# Patient Record
Sex: Female | Born: 1955 | Race: White | Hispanic: No | Marital: Married | Smoking: Former smoker
Health system: Southern US, Community
[De-identification: ages and names within clinical notes are randomized; demographics above are authoritative.]

## PROBLEM LIST (undated history)

## (undated) DIAGNOSIS — G8929 Other chronic pain: Secondary | ICD-10-CM

## (undated) DIAGNOSIS — B019 Varicella without complication: Secondary | ICD-10-CM

## (undated) DIAGNOSIS — R51 Headache: Secondary | ICD-10-CM

## (undated) DIAGNOSIS — G35 Multiple sclerosis: Secondary | ICD-10-CM

## (undated) DIAGNOSIS — M8580 Other specified disorders of bone density and structure, unspecified site: Secondary | ICD-10-CM

## (undated) DIAGNOSIS — R519 Headache, unspecified: Secondary | ICD-10-CM

## (undated) DIAGNOSIS — Z1211 Encounter for screening for malignant neoplasm of colon: Secondary | ICD-10-CM

## (undated) DIAGNOSIS — K219 Gastro-esophageal reflux disease without esophagitis: Secondary | ICD-10-CM

## (undated) DIAGNOSIS — N390 Urinary tract infection, site not specified: Secondary | ICD-10-CM

## (undated) DIAGNOSIS — N952 Postmenopausal atrophic vaginitis: Secondary | ICD-10-CM

## (undated) DIAGNOSIS — M810 Age-related osteoporosis without current pathological fracture: Secondary | ICD-10-CM

## (undated) HISTORY — DX: Gastro-esophageal reflux disease without esophagitis: K21.9

## (undated) HISTORY — DX: Age-related osteoporosis without current pathological fracture: M81.0

## (undated) HISTORY — DX: Varicella without complication: B01.9

## (undated) HISTORY — DX: Postmenopausal atrophic vaginitis: N95.2

## (undated) HISTORY — DX: Other chronic pain: G89.29

## (undated) HISTORY — DX: Other specified disorders of bone density and structure, unspecified site: M85.80

## (undated) HISTORY — DX: Headache, unspecified: R51.9

## (undated) HISTORY — DX: Headache: R51

## (undated) HISTORY — DX: Urinary tract infection, site not specified: N39.0

## (undated) HISTORY — DX: Encounter for screening for malignant neoplasm of colon: Z12.11

## (undated) HISTORY — DX: Multiple sclerosis: G35

---

## 2011-03-16 ENCOUNTER — Ambulatory Visit: Payer: Self-pay | Admitting: Oncology

## 2011-07-16 LAB — HM DEXA SCAN

## 2011-07-16 LAB — HM MAMMOGRAPHY

## 2011-07-22 LAB — HM PAP SMEAR

## 2011-08-30 ENCOUNTER — Ambulatory Visit: Payer: Self-pay | Admitting: Internal Medicine

## 2011-09-21 ENCOUNTER — Ambulatory Visit (INDEPENDENT_AMBULATORY_CARE_PROVIDER_SITE_OTHER): Payer: 59 | Admitting: Internal Medicine

## 2011-09-21 ENCOUNTER — Encounter: Payer: Self-pay | Admitting: Internal Medicine

## 2011-09-21 VITALS — BP 130/70 | HR 86 | Temp 98.7°F | Wt 139.0 lb

## 2011-09-21 DIAGNOSIS — J40 Bronchitis, not specified as acute or chronic: Secondary | ICD-10-CM

## 2011-09-21 DIAGNOSIS — R599 Enlarged lymph nodes, unspecified: Secondary | ICD-10-CM

## 2011-09-21 DIAGNOSIS — R591 Generalized enlarged lymph nodes: Secondary | ICD-10-CM

## 2011-09-21 DIAGNOSIS — G35 Multiple sclerosis: Secondary | ICD-10-CM

## 2011-09-21 MED ORDER — AZITHROMYCIN 250 MG PO TABS: ORAL_TABLET | ORAL | Status: AC

## 2011-09-21 NOTE — Progress Notes (Signed)
Subjective:    Patient ID: Kim Ball, female    DOB: 03/23/1956, 55 y.o.   MRN: [id number]  HPI 55 year old female with a history of multiple sclerosis presents to establish care. Her primary concern today is a 2 week history of nasal congestion and cough. She reports that when she first became ill she had general malaise and a couple of days of diarrhea. This resolved, and then she developed worsening nasal congestion with clear rhinorrhea and cough productive of purulent sputum. She had some chills but no fever. She reports mild shortness of breath. She has been taking over-the-counter cough and cold preparations. She reports the greatest improvement with TheraFlu.  She also notes that approximately 4 months ago she developed a nodular areas over her entire abdomen and in her axilla. She was evaluated by her primary care physician who treated her with meloxicam. She had an allergic reaction to meloxicam and stopped this. She was then seen by her neurologist who was concerned about the possibility of lymphoma in the setting of use of Betaseron. He referred her to another physician and they ultimately wanted her to see an oncologist. This referral was not set up. She was then treated by her neurologist with a course of prednisone. She reports that the nodular areas resolved and to date have not recurred. She denies any night sweats. She denies any pruritus. She denies any change in fatigue. She denies any weight loss. She does have a family history of lymphoma.  Outpatient Encounter Prescriptions as of 09/21/2011  Medication Sig Dispense Refill  . azithromycin (ZITHROMAX Z-PAK) 250 MG tablet Take 2 tablets (500 mg) on  Day 1,  followed by 1 tablet (250 mg) once daily on Days 2 through 5.  6 each  0  . Estradiol (VAGIFEM) 10 MCG TABS Place 1 tablet vaginally 2 (two) times a week.        . estradiol (VIVELLE-DOT) 0.025 MG/24HR Place 1 patch onto the skin every 3 (three) days.        . interferon beta-1b  (BETASERON) 0.3 MG injection Inject 0.5 mg into the skin every other day.        . metaxalone (SKELAXIN) 800 MG tablet Take 800 mg by mouth 3 (three) times daily as needed.        . nitrofurantoin, macrocrystal-monohydrate, (MACROBID) 100 MG capsule Take 100 mg by mouth once. After intercourse       . topiramate (TOPAMAX) 50 MG tablet Take 25 mg by mouth 2 (two) times daily.          Review of Systems  Constitutional: Negative for fever, chills, appetite change, fatigue and unexpected weight change.  HENT: Positive for congestion, rhinorrhea, postnasal drip and sinus pressure. Negative for ear pain, sore throat, trouble swallowing, neck pain and voice change.   Eyes: Negative for visual disturbance.  Respiratory: Positive for cough and shortness of breath. Negative for wheezing and stridor.   Cardiovascular: Negative for chest pain, palpitations and leg swelling.  Gastrointestinal: Positive for diarrhea. Negative for nausea, vomiting, abdominal pain, constipation, blood in stool, abdominal distention and anal bleeding.  Genitourinary: Negative for dysuria and flank pain.  Musculoskeletal: Negative for myalgias, arthralgias and gait problem.  Skin: Negative for color change and rash.  Neurological: Negative for dizziness and headaches.  Hematological: Positive for adenopathy. Does not bruise/bleed easily.  Psychiatric/Behavioral: Negative for suicidal ideas, sleep disturbance and dysphoric mood. The patient is not nervous/anxious.    BP 130/70  Pulse 86  Temp(Src)  98.7 F (37.1 C) (Oral)  Wt 139 lb (63.05 kg)  SpO2 97%     Objective:   Physical Exam  Constitutional: She is oriented to person, place, and time. She appears well-developed and well-nourished. No distress.  HENT:  Head: Normocephalic and atraumatic.  Right Ear: External ear normal.  Left Ear: External ear normal.  Nose: Nose normal.  Mouth/Throat: Oropharynx is clear and moist. No oropharyngeal exudate.  Eyes:  Conjunctivae are normal. Pupils are equal, round, and reactive to light. Right eye exhibits no discharge. Left eye exhibits no discharge. No scleral icterus.  Neck: Normal range of motion. Neck supple. No tracheal deviation present. No thyromegaly present.  Cardiovascular: Normal rate, regular rhythm, normal heart sounds and intact distal pulses.  Exam reveals no gallop and no friction rub.   No murmur heard. Pulmonary/Chest: Effort normal. No accessory muscle usage. Not tachypneic. No respiratory distress. She has no decreased breath sounds. She has no wheezes. She has rhonchi in the right lower field. She has no rales. She exhibits no tenderness.  Musculoskeletal: Normal range of motion. She exhibits no edema and no tenderness.  Lymphadenopathy:    She has cervical adenopathy.       Right cervical: Superficial cervical adenopathy present.  Neurological: She is alert and oriented to person, place, and time. No cranial nerve deficit. She exhibits normal muscle tone. Coordination normal.  Skin: Skin is warm and dry. No rash noted. She is not diaphoretic. No erythema. No pallor.  Psychiatric: She has a normal mood and affect. Her behavior is normal. Judgment and thought content normal.          Assessment & Plan:  1. Bronchitis - symptoms and exam most consistent with bronchitis. Will treat with azithromycin. She will continue over-the-counter cough and cold preparations as needed for cough. She will call if her symptoms are not improving within the next 48 hours.  2. Lymphadenopathy - recent episode of diffuse lymphadenopathy is concerning for lymphoma especially given her family history and her use of Betaseron. I am concerned that she received prednisone as treatment for this given that prednisone would be part of the treatment for lymphoma and may have suppressed some of her lymphadenopathy. However, it is reassuring that she has not had any recurrence within the last 4 months. We will  continue to monitor. We discussed possibly checking lab work including CBC, LDH, uric acid, sedimentation rate, CRP. She would prefer to hold off for now. We will obtain her previous records and she will followup in one month.  3. Multiple Sclerosis - Will get records from neurologist.

## 2011-10-22 ENCOUNTER — Ambulatory Visit: Payer: 59 | Admitting: Internal Medicine

## 2011-11-08 ENCOUNTER — Ambulatory Visit: Payer: 59 | Admitting: Internal Medicine

## 2011-11-19 ENCOUNTER — Ambulatory Visit: Payer: 59 | Admitting: Internal Medicine

## 2011-11-22 ENCOUNTER — Ambulatory Visit (INDEPENDENT_AMBULATORY_CARE_PROVIDER_SITE_OTHER): Payer: 59 | Admitting: Internal Medicine

## 2011-11-22 ENCOUNTER — Encounter: Payer: Self-pay | Admitting: Internal Medicine

## 2011-11-22 DIAGNOSIS — R222 Localized swelling, mass and lump, trunk: Secondary | ICD-10-CM | POA: Insufficient documentation

## 2011-11-22 DIAGNOSIS — L659 Nonscarring hair loss, unspecified: Secondary | ICD-10-CM | POA: Insufficient documentation

## 2011-11-22 LAB — CBC WITH DIFFERENTIAL/PLATELET
Basophils Relative: 0.9 % (ref 0.0–3.0)
Eosinophils Relative: 1.5 % (ref 0.0–5.0)
Lymphocytes Relative: 39.1 % (ref 12.0–46.0)
Neutrophils Relative %: 47.9 % (ref 43.0–77.0)
Platelets: 239 10*3/uL (ref 150.0–400.0)
RBC: 4.28 Mil/uL (ref 3.87–5.11)
WBC: 6.9 10*3/uL (ref 4.5–10.5)

## 2011-11-22 LAB — COMPREHENSIVE METABOLIC PANEL
AST: 20 U/L (ref 0–37)
Albumin: 3.8 g/dL (ref 3.5–5.2)
Alkaline Phosphatase: 60 U/L (ref 39–117)
Glucose, Bld: 88 mg/dL (ref 70–99)
Potassium: 3.9 mEq/L (ref 3.5–5.1)
Sodium: 139 mEq/L (ref 135–145)
Total Bilirubin: 0.4 mg/dL (ref 0.3–1.2)
Total Protein: 6.6 g/dL (ref 6.0–8.3)

## 2011-11-22 LAB — SEDIMENTATION RATE: Sed Rate: 24 mm/hr — ABNORMAL HIGH (ref 0–22)

## 2011-11-22 LAB — TSH: TSH: 0.93 u[IU]/mL (ref 0.35–5.50)

## 2011-11-22 NOTE — Progress Notes (Signed)
Subjective:    Patient ID: Kim Ball, female    DOB: Jan 28, 1956, 56 y.o.   MRN: [id number]  HPI 56 YO female with history of MS presents for followup. She is concerned today about her current nodules over her abdomen. This happened in the past and she was treated with a course of steroids by her neurologist with resolution of the nodular areas. She notes that the nodular areas have recurred both in her anterior abdomen and on her right lateral chest wall. They are not tender. She has also noted some lymphadenopathy in her neck. She denies any fever, chills, night sweats, change in energy level. She does have some chronic fatigue with her MS. She denies any rash or pruritis. She denies any abdominal pain, nausea, vomiting. She denies change in bowel habits. She has noted some hair loss described as spinning of her hair across her anterior scalp. This is been going on for several weeks. She recently stopped taking her Topamax because she was concerned that this medication might be leading to hair loss. She has not noted any change yet.  Outpatient Encounter Prescriptions as of 11/22/2011  Medication Sig Dispense Refill  . Estradiol (VAGIFEM) 10 MCG TABS Place 1 tablet vaginally 2 (two) times a week.        . estradiol (VIVELLE-DOT) 0.025 MG/24HR Place 1 patch onto the skin every 3 (three) days.        . interferon beta-1b (BETASERON) 0.3 MG injection Inject 0.5 mg into the skin every other day.        . metaxalone (SKELAXIN) 800 MG tablet Take 800 mg by mouth 3 (three) times daily as needed.        . nitrofurantoin, macrocrystal-monohydrate, (MACROBID) 100 MG capsule Take 100 mg by mouth once. After intercourse       . DISCONTD: topiramate (TOPAMAX) 50 MG tablet Take 25 mg by mouth 2 (two) times daily.          Review of Systems  Constitutional: Positive for fatigue. Negative for fever, chills, appetite change and unexpected weight change.  HENT: Negative for ear pain, congestion, sore throat,  trouble swallowing, neck pain, voice change and sinus pressure.   Eyes: Negative for visual disturbance.  Respiratory: Negative for cough, shortness of breath, wheezing and stridor.   Cardiovascular: Negative for chest pain, palpitations and leg swelling.  Gastrointestinal: Negative for nausea, vomiting, abdominal pain, diarrhea, constipation, blood in stool, abdominal distention and anal bleeding.  Genitourinary: Negative for dysuria and flank pain.  Musculoskeletal: Negative for myalgias, arthralgias and gait problem.  Skin: Negative for color change and rash.  Neurological: Negative for dizziness and headaches.  Hematological: Positive for adenopathy. Does not bruise/bleed easily.  Psychiatric/Behavioral: Negative for suicidal ideas, sleep disturbance and dysphoric mood. The patient is not nervous/anxious.    BP 108/78  Pulse 85  Temp(Src) 97.8 F (36.6 C) (Oral)  Ht 5\' 2"  (1.575 m)  Wt 142 lb (64.411 kg)  BMI 25.97 kg/m2  SpO2 96%     Objective:   Physical Exam  Constitutional: She is oriented to person, place, and time. She appears well-developed and well-nourished. No distress.  HENT:  Head: Normocephalic and atraumatic.  Right Ear: External ear normal.  Left Ear: External ear normal.  Nose: Nose normal.  Mouth/Throat: Oropharynx is clear and moist. No oropharyngeal exudate.  Eyes: Conjunctivae are normal. Pupils are equal, round, and reactive to light. Right eye exhibits no discharge. Left eye exhibits no discharge. No scleral icterus.  Neck:  Normal range of motion. Neck supple. No tracheal deviation present. No thyromegaly present.  Cardiovascular: Normal rate, regular rhythm, normal heart sounds and intact distal pulses.  Exam reveals no gallop and no friction rub.   No murmur heard. Pulmonary/Chest: Effort normal and breath sounds normal. No respiratory distress. She has no wheezes. She has no rales. She exhibits no tenderness.  Abdominal: Soft. Bowel sounds are normal.  She exhibits mass. She exhibits no distension. There is tenderness in the left lower quadrant. There is no rebound and no guarding.    Musculoskeletal: Normal range of motion. She exhibits no edema and no tenderness.  Lymphadenopathy:    She has no cervical adenopathy.  Neurological: She is alert and oriented to person, place, and time. No cranial nerve deficit. She exhibits normal muscle tone. Coordination normal.  Skin: Skin is warm and dry. No rash noted. She is not diaphoretic. No erythema. No pallor.  Psychiatric: She has a normal mood and affect. Her behavior is normal. Judgment and thought content normal.          Assessment & Plan:

## 2011-11-22 NOTE — Assessment & Plan Note (Addendum)
Unclear etiology. Symptoms are recurrent. During her last episode, she was placed on steroids by her neurologist and had improvement in her nodules. Question if she may have lymphadenopathy, such as in the case of lymphoma which is responsive to steroids. Will send basic lab work today. Including, CBC, CMP, ESR, CRP, LDH. If lab work is normal, will proceed with CT of the abdomen for further evaluation. On exam, it does not appear that any of the nodular areas are prominent enough for superficial biopsy.

## 2011-11-22 NOTE — Assessment & Plan Note (Signed)
Unclear etiology.  Will see if any improvement off topamax. Will also check TSH with labs.

## 2011-11-23 LAB — ANTI-NUCLEAR AB-TITER (ANA TITER): ANA Titer 1: 1:40 {titer} — ABNORMAL HIGH

## 2011-11-23 LAB — ANA: Anti Nuclear Antibody(ANA): POSITIVE — AB

## 2011-12-20 ENCOUNTER — Encounter: Payer: Self-pay | Admitting: Internal Medicine

## 2011-12-20 ENCOUNTER — Ambulatory Visit (INDEPENDENT_AMBULATORY_CARE_PROVIDER_SITE_OTHER): Payer: 59 | Admitting: Internal Medicine

## 2011-12-20 VITALS — BP 110/68 | HR 86 | Temp 98.4°F | Wt 145.0 lb

## 2011-12-20 DIAGNOSIS — R222 Localized swelling, mass and lump, trunk: Secondary | ICD-10-CM

## 2011-12-20 DIAGNOSIS — L650 Telogen effluvium: Secondary | ICD-10-CM

## 2011-12-20 DIAGNOSIS — E785 Hyperlipidemia, unspecified: Secondary | ICD-10-CM

## 2011-12-20 LAB — HM COLONOSCOPY

## 2011-12-20 NOTE — Assessment & Plan Note (Signed)
Areas have been stable. Patient was evaluated by her dermatologist who felt they were most consistent with lipoma. Lab work was normal. We'll continue to monitor.

## 2011-12-20 NOTE — Progress Notes (Signed)
Subjective:    Patient ID: Kim Ball, female    DOB: 1956-01-23, 56 y.o.   MRN: [id number]  HPI 56 year old female with history of MS presents for followup. She was last seen and evaluated for nodular areas over her abdomen. 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 She also had lab work including CBC, LDH, CMP all of which were normal. She reports that the lesions have been stable since her last visit in are not bothersome to her.  She was also evaluated for hair loss. 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 She has been started on Lidex but has not noticed a significant change over the last week.  Outpatient Encounter Prescriptions as of 12/20/2011  Medication Sig Dispense Refill  . Ascorbic Acid (VITAMIN C) 100 MG tablet Take 100 mg by mouth daily.      . Calcium-Vitamin D-Vitamin K (CALCIUM + D + K PO) Take by mouth.      . Estradiol (VAGIFEM) 10 MCG TABS Place 1 tablet vaginally 2 (two) times a week.        . estradiol (VIVELLE-DOT) 0.025 MG/24HR Place 1 patch onto the skin every 3 (three) days.        . fish oil-omega-3 fatty acids 1000 MG capsule Take 2 g by mouth daily.      . fluocinonide (LIDEX) 0.05 % external solution Apply 1 application topically 2 (two) times daily.      . interferon beta-1b (BETASERON) 0.3 MG injection Inject 0.5 mg into the skin every other day.        . metaxalone (SKELAXIN) 800 MG tablet Take 800 mg by mouth 3 (three) times daily as needed.        . Multiple Vitamins-Minerals (MULTIVITAMIN WITH MINERALS) tablet Take 1 tablet by mouth daily.      . nitrofurantoin, macrocrystal-monohydrate, (MACROBID) 100 MG capsule Take 100 mg by mouth once. After intercourse         Review of Systems  Constitutional: Negative for fever, chills, appetite change, fatigue and unexpected weight change.  HENT: Negative for ear pain, congestion, sore throat, trouble swallowing,  neck pain, voice change and sinus pressure.   Eyes: Negative for visual disturbance.  Respiratory: Negative for cough, shortness of breath, wheezing and stridor.   Cardiovascular: Negative for chest pain, palpitations and leg swelling.  Gastrointestinal: Negative for nausea, vomiting, abdominal pain, diarrhea, constipation, blood in stool, abdominal distention and anal bleeding.  Genitourinary: Negative for dysuria and flank pain.  Musculoskeletal: Negative for myalgias, arthralgias and gait problem.  Skin: Negative for color change and rash.  Neurological: Negative for dizziness and headaches.  Hematological: Negative for adenopathy. Does not bruise/bleed easily.  Psychiatric/Behavioral: Negative for suicidal ideas, sleep disturbance and dysphoric mood. The patient is not nervous/anxious.    BP 110/68  Pulse 86  Temp(Src) 98.4 F (36.9 C) (Oral)  Wt 145 lb (65.772 kg)  SpO2 95%     Objective:   Physical Exam  Constitutional: She is oriented to person, place, and time. She appears well-developed and well-nourished. No distress.  HENT:  Head: Normocephalic and atraumatic.  Right Ear: External ear normal.  Left Ear: External ear normal.  Nose: Nose normal.  Mouth/Throat: Oropharynx is clear and moist. No oropharyngeal exudate.  Eyes: Conjunctivae are normal. Pupils are equal, round, and reactive to light. Right eye exhibits no discharge. Left eye exhibits no discharge. No scleral icterus.  Neck:  Normal range of motion. Neck supple. No tracheal deviation present. No thyromegaly present.  Cardiovascular: Normal rate, regular rhythm, normal heart sounds and intact distal pulses.  Exam reveals no gallop and no friction rub.   No murmur heard. Pulmonary/Chest: Effort normal and breath sounds normal. No respiratory distress. She has no wheezes. She has no rales. She exhibits no tenderness.  Musculoskeletal: Normal range of motion. She exhibits no edema and no tenderness.  Lymphadenopathy:      She has no cervical adenopathy.  Neurological: She is alert and oriented to person, place, and time. No cranial nerve deficit. She exhibits normal muscle tone. Coordination normal.  Skin: Skin is warm and dry. No rash noted. She is not diaphoretic. No erythema. No pallor.       Thinning of hair noted bilateral forehead at hairline.  Psychiatric: She has a normal mood and affect. Her behavior is normal. Judgment and thought content normal.          Assessment & Plan:

## 2011-12-20 NOTE — Assessment & Plan Note (Signed)
Seen by dermatology and started on Lidex. We'll continue to monitor to see if any improvement. Followup in one year or as needed.

## 2012-03-11 ENCOUNTER — Telehealth: Payer: Self-pay | Admitting: Internal Medicine

## 2012-03-11 NOTE — Telephone Encounter (Signed)
Caller: [name]/Patient; PCP: Ronna Polio; CB#: [phone]. Call regarding Cold Sxs that began Monday 5/20. Non productive cough despite expectorant, ears draining, ear congestion, eyes are matted, headache, facial pain and pain in her teeth. Reports she has felt worse and worse over the past 2 days. Very weak, tires easily and has to sit freq. Caller reports she had pneumonia appx 2 months ago and feels as if it is returning. Afebrile.  Per URI Protocol, See in 24 Hr Protocol, No open appts in [facility] until Friday. Caller advised to be seen at [facility] for eval of sxs. She is agreeable.

## 2012-11-21 ENCOUNTER — Telehealth: Payer: Self-pay | Admitting: Internal Medicine

## 2012-11-21 NOTE — Telephone Encounter (Signed)
Patient is wanting her labs draw at this office that Dr. Clelia Croft and [facility] have ordered. Will it be ok to have this done at this office. If this is ok please call the patient.

## 2012-11-24 NOTE — Telephone Encounter (Signed)
Patient does have the lab order from the dermatologist. Dr. Clelia Croft office will call this office to inform us of the labs that need to be done.

## 2012-11-25 ENCOUNTER — Telehealth: Payer: Self-pay | Admitting: General Practice

## 2012-11-25 NOTE — Telephone Encounter (Signed)
Can we wait until her physical to do her labs?

## 2012-11-25 NOTE — Telephone Encounter (Signed)
[facility] called stating that they have been trying to fax paperwork over regarding this pt. States she needs the following labs :CBC, LFT, Bilirubin, VZV Antibody Serology, and an EKG. Pt should be making an appt. Has a CPE on 12/22/12.

## 2012-11-26 ENCOUNTER — Telehealth: Payer: Self-pay | Admitting: Internal Medicine

## 2012-11-26 NOTE — Telephone Encounter (Signed)
Received message from pt neurologist. He requested EKG, and labs including CBC, CMP, and VZV antibody to be faxed to [phone]. We can perform this at her physical exam

## 2012-11-26 NOTE — Telephone Encounter (Addendum)
Insurance is not covering her medication anymore for her MS, so they are trying to get all this done so she may get on new medication. Also she seen her dermatologist and he is requesting blood work too. Per patient she does have enough injections to last until then and if ok, she can come in a week earlier from her CPE to have those labs done.

## 2012-11-26 NOTE — Telephone Encounter (Signed)
OK. Let's move her physical one week earlier if possible

## 2012-11-26 NOTE — Telephone Encounter (Signed)
Nothing is available a week prior for her to be moved up.

## 2012-11-26 NOTE — Telephone Encounter (Signed)
Spoke with patient, she will bring their orders in when she comes to have other labs done.

## 2012-11-29 ENCOUNTER — Other Ambulatory Visit: Payer: Self-pay

## 2012-12-01 NOTE — Telephone Encounter (Signed)
Patient is aware of this

## 2012-12-22 ENCOUNTER — Encounter: Payer: Self-pay | Admitting: Internal Medicine

## 2012-12-22 ENCOUNTER — Ambulatory Visit (INDEPENDENT_AMBULATORY_CARE_PROVIDER_SITE_OTHER): Payer: 59 | Admitting: Internal Medicine

## 2012-12-22 ENCOUNTER — Telehealth: Payer: Self-pay | Admitting: Internal Medicine

## 2012-12-22 VITALS — BP 120/92 | HR 83 | Temp 98.2°F | Ht 62.0 in | Wt 144.0 lb

## 2012-12-22 DIAGNOSIS — R9431 Abnormal electrocardiogram [ECG] [EKG]: Secondary | ICD-10-CM

## 2012-12-22 DIAGNOSIS — Z0000 Encounter for general adult medical examination without abnormal findings: Secondary | ICD-10-CM

## 2012-12-22 DIAGNOSIS — G35 Multiple sclerosis: Secondary | ICD-10-CM

## 2012-12-22 LAB — CBC WITH DIFFERENTIAL/PLATELET
Basophils Absolute: 0.1 10*3/uL (ref 0.0–0.1)
Basophils Relative: 0.6 % (ref 0.0–3.0)
Eosinophils Absolute: 0.1 10*3/uL (ref 0.0–0.7)
Lymphocytes Relative: 29.8 % (ref 12.0–46.0)
MCHC: 34.2 g/dL (ref 30.0–36.0)
Neutrophils Relative %: 58.4 % (ref 43.0–77.0)
RBC: 4.74 Mil/uL (ref 3.87–5.11)

## 2012-12-22 NOTE — Assessment & Plan Note (Signed)
General medical exam normal today including breast exam. PAP and pelvic deferred as completed by her OB. Mammogram UTD, will request copy. Will check CBC, CMP, lipids with labs today.

## 2012-12-22 NOTE — Telephone Encounter (Signed)
[phone]  Angelina Pih called from Dr. Margaretmary Eddy office regarding pt Gilen[place] preassessments needed for Gilenya being requested by neurologist Dr. Sherryll Burger.  Pt has appt at 10:30. Needed:   CBC, LFTs, bilirubin, hx of chicken pox or varicella, EKG  These are needed to determine if she can move forward for Gilenya.

## 2012-12-22 NOTE — Telephone Encounter (Signed)
Pt states she has an appt this morning at 10:30 for a physical but would like to change the appt to be able to get an EKG and blood work that is needed for the pt's Jelenia injections for MS.  Pt states Dr. Margaretmary Eddy office has been trying to get with [place] regarding orders that are needed to begin the pt on [place].

## 2012-12-22 NOTE — Assessment & Plan Note (Signed)
Per neurologist request will check CMP, varicella immunity today prior to pt starting new medication.

## 2012-12-22 NOTE — Progress Notes (Signed)
Subjective:    Patient ID: Kim Ball, female    DOB: 01-27-1956, 57 y.o.   MRN: [id number]  HPI 57 year old female presents for annual exam. She reports she is generally doing well. She notes that her neurologist is planning to change her medication for multiple sclerosis to another medication and she needs to have lab work and EKG performed. She is looking forward to being on a non-injectable medication. She reports that symptoms have generally been well-controlled. She reports she is generally feeling well. She has no concerns today. She tries to follow a healthy diet and get regular physical activity. She is UTD on PAP, which is performed by OB/GYN.  She is UTD on mammogram. She declines colonoscopy.   Outpatient Encounter Prescriptions as of 12/22/2012  Medication Sig Dispense Refill  . Ascorbic Acid (VITAMIN C) 100 MG tablet Take 100 mg by mouth daily.      . Calcium-Vitamin D-Vitamin K (CALCIUM + D + K PO) Take by mouth.      . Estradiol (VAGIFEM) 10 MCG TABS Place 1 tablet vaginally 2 (two) times a week.        . estradiol (VIVELLE-DOT) 0.025 MG/24HR Place 1 patch onto the skin every 3 (three) days.        . fish oil-omega-3 fatty acids 1000 MG capsule Take 2 g by mouth daily.      . fluocinonide (LIDEX) 0.05 % external solution Apply 1 application topically 2 (two) times daily.      . interferon beta-1b (BETASERON) 0.3 MG injection Inject 0.5 mg into the skin every other day.        . metaxalone (SKELAXIN) 800 MG tablet Take 800 mg by mouth 3 (three) times daily as needed.        . Multiple Vitamins-Minerals (MULTIVITAMIN WITH MINERALS) tablet Take 1 tablet by mouth daily.      . nitrofurantoin, macrocrystal-monohydrate, (MACROBID) 100 MG capsule Take 100 mg by mouth once. After intercourse        No facility-administered encounter medications on file as of 12/22/2012.   BP 120/92  Pulse 83  Temp(Src) 98.2 F (36.8 C) (Oral)  Ht 5\' 2"  (1.575 m)  Wt 144 lb (65.318 kg)  BMI 26.33  kg/m2  SpO2 96%  Review of Systems  Constitutional: Negative for fever, chills, appetite change, fatigue and unexpected weight change.  HENT: Negative for ear pain, congestion, sore throat, trouble swallowing, neck pain, voice change and sinus pressure.   Eyes: Negative for visual disturbance.  Respiratory: Negative for cough, shortness of breath, wheezing and stridor.   Cardiovascular: Negative for chest pain, palpitations and leg swelling.  Gastrointestinal: Negative for nausea, vomiting, abdominal pain, diarrhea, constipation, blood in stool, abdominal distention and anal bleeding.  Genitourinary: Negative for dysuria and flank pain.  Musculoskeletal: Negative for myalgias, arthralgias and gait problem.  Skin: Negative for color change and rash.  Neurological: Negative for dizziness and headaches.  Hematological: Negative for adenopathy. Does not bruise/bleed easily.  Psychiatric/Behavioral: Negative for suicidal ideas, sleep disturbance and dysphoric mood. The patient is not nervous/anxious.        Objective:   Physical Exam  Constitutional: She is oriented to person, place, and time. She appears well-developed and well-nourished. No distress.  HENT:  Head: Normocephalic and atraumatic.  Right Ear: External ear normal.  Left Ear: External ear normal.  Nose: Nose normal.  Mouth/Throat: Oropharynx is clear and moist. No oropharyngeal exudate.  Eyes: Conjunctivae are normal. Pupils are equal, round, and reactive to  light. Right eye exhibits no discharge. Left eye exhibits no discharge. No scleral icterus.  Neck: Normal range of motion. Neck supple. No tracheal deviation present. No thyromegaly present.  Cardiovascular: Normal rate, regular rhythm, normal heart sounds and intact distal pulses.  Exam reveals no gallop and no friction rub.   No murmur heard. Pulmonary/Chest: Effort normal and breath sounds normal. No accessory muscle usage. Not tachypneic. No respiratory distress. She  has no decreased breath sounds. She has no wheezes. She has no rhonchi. She has no rales. She exhibits no tenderness. Right breast exhibits no inverted nipple, no mass, no nipple discharge, no skin change and no tenderness. Left breast exhibits no inverted nipple, no mass, no nipple discharge, no skin change and no tenderness. Breasts are symmetrical.  Abdominal: Soft. Bowel sounds are normal. She exhibits no distension and no mass. There is no tenderness. There is no rebound and no guarding.  Musculoskeletal: Normal range of motion. She exhibits no edema and no tenderness.  Lymphadenopathy:    She has no cervical adenopathy.  Neurological: She is alert and oriented to person, place, and time. No cranial nerve deficit. She exhibits normal muscle tone. Coordination normal.  Skin: Skin is warm and dry. No rash noted. She is not diaphoretic. No erythema. No pallor.  Psychiatric: She has a normal mood and affect. Her behavior is normal. Judgment and thought content normal.          Assessment & Plan:

## 2012-12-22 NOTE — Assessment & Plan Note (Signed)
Discussed with pt. No symptoms either current or in the past of chest pain, dyspnea, exercise intolerance. Discussed potentially getting additional testing including stress test, but will discuss first with her neurologist who requested EKG prior to starting new medication for MS.

## 2012-12-23 LAB — LIPID PANEL
Total CHOL/HDL Ratio: 6
Triglycerides: 357 mg/dL — ABNORMAL HIGH (ref 0.0–149.0)

## 2012-12-23 LAB — COMPREHENSIVE METABOLIC PANEL
ALT: 21 U/L (ref 0–35)
AST: 24 U/L (ref 0–37)
Albumin: 3.9 g/dL (ref 3.5–5.2)
Alkaline Phosphatase: 61 U/L (ref 39–117)
BUN: 15 mg/dL (ref 6–23)
Potassium: 4.1 mEq/L (ref 3.5–5.1)
Sodium: 138 mEq/L (ref 135–145)
Total Protein: 6.9 g/dL (ref 6.0–8.3)

## 2012-12-23 LAB — FERRITIN: Ferritin: 590.7 ng/mL — ABNORMAL HIGH (ref 10.0–291.0)

## 2012-12-23 LAB — VARICELLA ZOSTER ANTIBODY, IGG: Varicella IgG: >4000 Index — ABNORMAL HIGH (ref <135.00)

## 2013-02-14 ENCOUNTER — Emergency Department: Payer: Self-pay | Admitting: Emergency Medicine

## 2013-02-14 LAB — COMPREHENSIVE METABOLIC PANEL
Albumin: 3.5 g/dL (ref 3.4–5.0)
Calcium, Total: 9 mg/dL (ref 8.5–10.1)
Co2: 25 mmol/L (ref 21–32)
Creatinine: 0.79 mg/dL (ref 0.60–1.30)
EGFR (African American): >60
Glucose: 96 mg/dL (ref 65–99)
Osmolality: 285 (ref 275–301)
SGOT(AST): 35 U/L (ref 15–37)

## 2013-02-14 LAB — URINALYSIS, COMPLETE
Bacteria: NONE SEEN
Bilirubin,UR: NEGATIVE
Blood: NEGATIVE
Glucose,UR: NEGATIVE mg/dL (ref 0–75)
Protein: NEGATIVE
RBC,UR: <1 /HPF (ref 0–5)
Specific Gravity: 1.019 (ref 1.003–1.030)
Squamous Epithelial: 1
WBC UR: <1 /HPF (ref 0–5)

## 2013-02-14 LAB — CBC
HGB: 15.4 g/dL (ref 12.0–16.0)
MCV: 92 fL (ref 80–100)
Platelet: 287 10*3/uL (ref 150–440)
RBC: 4.86 10*6/uL (ref 3.80–5.20)
RDW: 14.1 % (ref 11.5–14.5)

## 2013-03-13 ENCOUNTER — Encounter: Payer: Self-pay | Admitting: Internal Medicine

## 2013-03-13 ENCOUNTER — Ambulatory Visit (INDEPENDENT_AMBULATORY_CARE_PROVIDER_SITE_OTHER): Payer: 59 | Admitting: Internal Medicine

## 2013-03-13 ENCOUNTER — Telehealth: Payer: Self-pay | Admitting: Internal Medicine

## 2013-03-13 VITALS — BP 100/80 | HR 87 | Wt 145.0 lb

## 2013-03-13 DIAGNOSIS — B002 Herpesviral gingivostomatitis and pharyngotonsillitis: Secondary | ICD-10-CM

## 2013-03-13 MED ORDER — VALACYCLOVIR HCL 1 G PO TABS: 1000.0000 mg | ORAL_TABLET | Freq: Two times a day (BID) | ORAL | Status: AC

## 2013-03-13 NOTE — Assessment & Plan Note (Signed)
Severe herpes stomatitis breakout after starting new immunosuppressive regimen for MS. Will treat with Valtrex and continue topical anesthetic. Follow up if symptoms are not improving over next 48hr. Follow up next week for recheck.

## 2013-03-13 NOTE — Telephone Encounter (Signed)
She will need to be seen for this. Can we work her in this afternoon?

## 2013-03-13 NOTE — Telephone Encounter (Signed)
Patient scheduled and seen.

## 2013-03-13 NOTE — Progress Notes (Signed)
  Subjective:    Patient ID: Kim Ball, female    DOB: 02-16-1956, 57 y.o.   MRN: [id number]  HPI 57YO female with MS presents for acute visit complaining of several days of diffuse oral lesions. Lesions are ulcerated and painful. No improvement with topical numbing meds. Notes recent Solumedrol dose pack for MS flare 2 weeks ago. No trouble swallowing. No shortness of breath, fever.  Outpatient Prescriptions Prior to Visit  Medication Sig Dispense Refill  . Ascorbic Acid (VITAMIN C) 100 MG tablet Take 100 mg by mouth daily.      . Calcium-Vitamin D-Vitamin K (CALCIUM + D + K PO) Take by mouth.      . Estradiol (VAGIFEM) 10 MCG TABS Place 1 tablet vaginally 2 (two) times a week.        . estradiol (VIVELLE-DOT) 0.025 MG/24HR Place 1 patch onto the skin every 3 (three) days.        . fish oil-omega-3 fatty acids 1000 MG capsule Take 2 g by mouth daily.      . fluocinonide (LIDEX) 0.05 % external solution Apply 1 application topically 2 (two) times daily.      . metaxalone (SKELAXIN) 800 MG tablet Take 800 mg by mouth 3 (three) times daily as needed.        . Multiple Vitamins-Minerals (MULTIVITAMIN WITH MINERALS) tablet Take 1 tablet by mouth daily.      . nitrofurantoin, macrocrystal-monohydrate, (MACROBID) 100 MG capsule Take 100 mg by mouth once. After intercourse       . interferon beta-1b (BETASERON) 0.3 MG injection Inject 0.5 mg into the skin every other day.         No facility-administered medications prior to visit.   BP 100/80  Pulse 87  Wt 145 lb (65.772 kg)  BMI 26.51 kg/m2  SpO2 98%  Review of Systems  Constitutional: Negative for fever, chills and fatigue.  HENT: Positive for mouth sores. Negative for sore throat, drooling, trouble swallowing and voice change.   Respiratory: Negative for shortness of breath and stridor.        Objective:   Physical Exam  Constitutional: She appears well-developed and well-nourished. No distress.  HENT:  Head: Normocephalic and  atraumatic.  Mouth/Throat: Oral lesions (ulcerations over lips and buccal mucosa) present.  Eyes: Pupils are equal, round, and reactive to light.  Neck: Normal range of motion. Neck supple.  Pulmonary/Chest: Effort normal.  Skin: She is not diaphoretic.          Assessment & Plan:

## 2013-03-13 NOTE — Telephone Encounter (Signed)
Patient Information:  Caller Name: Niah  Phone: [phone]  Patient: Kim Ball, Kim Ball  Gender: Female  DOB: 02-May-1956  Age: 57 Years  PCP: Ronna Polio (Adults only)  Office Follow Up:  Does the office need to follow up with this patient?: Yes  Instructions For The Office: All appt are full- please call pt regarding possible appt today  Pt instructed all appt are full for today and message will be sent to scheduler and she will receive a call back from office today; voices understanding   Symptoms  Reason For Call & Symptoms: Pt states that she has MS and started  Gilenya 3 months ago; also had MS relapse 1 month ago and was on Medrol for 5 days; pt reports the entire whole mouth is covered with cold sores; 4 sores located on outside of lips and and one sore noted on inside of mouth; no hx of herpes noted; having a hard time talking and eating due to the pain; no drainage;  Reviewed Health History In EMR: Yes  Reviewed Medications In EMR: Yes  Reviewed Allergies In EMR: Yes  Reviewed Surgeries / Procedures: Yes  Date of Onset of Symptoms: 03/10/2013  Treatments Tried: [facility] cold sore treatment  Treatments Tried Worked: No  Guideline(s) Used:  Cold Sores - Fever Blisters of Lip  Disposition Per Guideline:   See Today in Office  Reason For Disposition Reached:   Weak immune system (e.g., HIV positive, cancer chemo, splenectomy, organ transplant, chronic steroids)  Advice Given:  Call Back If:  You become worse  Call Back If:  Sores look infected (spreading redness)  You become worse  Call Back If:  Sores look infected (spreading redness)  Sores occur near or in the eye  You become worse  Patient Will Follow Care Advice:  YES

## 2013-03-13 NOTE — Telephone Encounter (Signed)
Please read below...

## 2013-03-16 ENCOUNTER — Ambulatory Visit: Payer: 59 | Admitting: Internal Medicine

## 2013-03-20 ENCOUNTER — Ambulatory Visit: Payer: 59 | Admitting: Internal Medicine

## 2013-03-27 ENCOUNTER — Encounter: Payer: Self-pay | Admitting: Neurology

## 2013-08-20 ENCOUNTER — Other Ambulatory Visit: Payer: Self-pay

## 2013-10-27 ENCOUNTER — Telehealth: Payer: Self-pay | Admitting: Internal Medicine

## 2013-10-27 NOTE — Telephone Encounter (Signed)
Attempted call back to only number on file. LM on unidentified machine for call back.

## 2013-10-27 NOTE — Telephone Encounter (Signed)
Left voicemail. FYI

## 2013-11-19 ENCOUNTER — Encounter: Payer: Self-pay | Admitting: Adult Health

## 2013-11-19 ENCOUNTER — Ambulatory Visit (INDEPENDENT_AMBULATORY_CARE_PROVIDER_SITE_OTHER): Payer: 59 | Admitting: Adult Health

## 2013-11-19 VITALS — BP 112/72 | HR 81 | Resp 12 | Wt 151.0 lb

## 2013-11-19 DIAGNOSIS — R040 Epistaxis: Secondary | ICD-10-CM | POA: Insufficient documentation

## 2013-11-19 LAB — PROTIME-INR
INR: 1 ratio (ref 0.8–1.0)
PROTHROMBIN TIME: 10.7 s (ref 10.2–12.4)

## 2013-11-19 MED ORDER — OXYMETAZOLINE HCL 0.05 % NA SOLN: NASAL | Status: AC

## 2013-11-19 NOTE — Progress Notes (Signed)
Pre visit review using our clinic review tool, if applicable. No additional management support is needed unless otherwise documented below in the visit note. 

## 2013-11-19 NOTE — Progress Notes (Signed)
Patient ID: Kim Ball, female   DOB: [birth year], 58 y.o.   MRN: [id number]    Subjective:    Patient ID: Kim Ball, female    DOB: 02/14/1956, 58 y.o.   MRN: [id number]  HPI  Pt is a pleasant 58 y/o female with c/o nose bleeds occuring multiple times daily. First started ~ 2 weeks ago. She reports having URI around Christmas time. She is still feeling pressure within her sinuses and feels she has fluid in her ears. She has an irritation inside her nostril on the right side.   Past Medical History  Diagnosis Date  . MS (multiple sclerosis)     Dr. Sherryll Burger  . GERD (gastroesophageal reflux disease)   . Chicken pox   . Chronic headaches   . Migraine   . UTI (lower urinary tract infection)     Current Outpatient Prescriptions on File Prior to Visit  Medication Sig Dispense Refill  . Ascorbic Acid (VITAMIN C) 100 MG tablet Take 100 mg by mouth daily.      . Calcium-Vitamin D-Vitamin K (CALCIUM + D + K PO) Take by mouth.      . Estradiol (VAGIFEM) 10 MCG TABS Place 1 tablet vaginally 2 (two) times a week.        . estradiol (VIVELLE-DOT) 0.025 MG/24HR Place 1 patch onto the skin every 3 (three) days.        . Fingolimod HCl (GILENYA) 0.5 MG CAPS Take 0.5 mg by mouth daily.      . fish oil-omega-3 fatty acids 1000 MG capsule Take 2 g by mouth daily.      . fluocinonide (LIDEX) 0.05 % external solution Apply 1 application topically 2 (two) times daily.      . metaxalone (SKELAXIN) 800 MG tablet Take 800 mg by mouth 3 (three) times daily as needed.        . Multiple Vitamins-Minerals (MULTIVITAMIN WITH MINERALS) tablet Take 1 tablet by mouth daily.      . nitrofurantoin, macrocrystal-monohydrate, (MACROBID) 100 MG capsule Take 100 mg by mouth once. After intercourse       . valACYclovir (VALTREX) 1000 MG tablet Take 1 tablet (1,000 mg total) by mouth 2 (two) times daily.  10 tablet  0   No current facility-administered medications on file prior to visit.     Review of Systems    Constitutional: Negative.   HENT: Positive for nosebleeds and sinus pressure. Negative for congestion and rhinorrhea.   Respiratory: Negative.   Cardiovascular: Negative.   All other systems reviewed and are negative.       Objective:  BP 112/72  Pulse 81  Resp 12  Wt 151 lb (68.493 kg)  SpO2 97%   Physical Exam  Constitutional: She is oriented to person, place, and time. She appears well-developed and well-nourished. No distress.  HENT:  Head: Normocephalic and atraumatic.  Right Ear: External ear normal.  Left Ear: External ear normal.  Small ulceration on septum right at the opening of nostril on the right. No other irritation noted within the nostrils.  Cardiovascular: Normal rate and regular rhythm.   Pulmonary/Chest: Effort normal. No respiratory distress.  Musculoskeletal: Normal range of motion.  Neurological: She is alert and oriented to person, place, and time.  Skin: Skin is warm and dry.  Psychiatric: She has a normal mood and affect. Her behavior is normal. Judgment and thought content normal.       Assessment & Plan:   1. Epistaxis Small ulceration at the  opening of the right nostril. No other abnormality noted. Try Afrin x 3 days. If no improvement, refer to ENT  - INR/PT - oxymetazoline (AFRIN NASAL SPRAY) 0.05 % nasal spray; Use twice daily for 3 days for nosebleeds  Dispense: 30 mL; Refill: 0

## 2013-11-19 NOTE — Patient Instructions (Signed)
  Have your blood work drawn prior to leaving.  Use Afrin nasal spray twice a day for 3 days.  Use saline spray frequently to moisten and irrigate the sinuses.  Vaseline at the opening of your nostrils.  Cool mist humidifier will help symptoms. Continue using.  If symptoms do not improve please call me and I will refer you to ENT

## 2013-11-23 NOTE — Telephone Encounter (Signed)
Mailed unread message to pt  

## 2014-01-12 IMAGING — CT CT HEAD WITHOUT CONTRAST
1 series · 16 of 29 positions shown, 20 images · non-contrast
Comparison: none

REASON FOR EXAM: Weakness, right eye pain, h/o MS
COMMENTS:

PROCEDURE:     CT  - CT HEAD WITHOUT CONTRAST  - February 14, 2013  [DATE]
RESULT:     History: Weakness. Right pain.
Comparison Study: No prior.

[Series 2: soft tissue · axial · 0.41mm/px · z∈[-161,-31]mm · 16 of 29 slices shown, 20 images]
[im 2/29  brain]
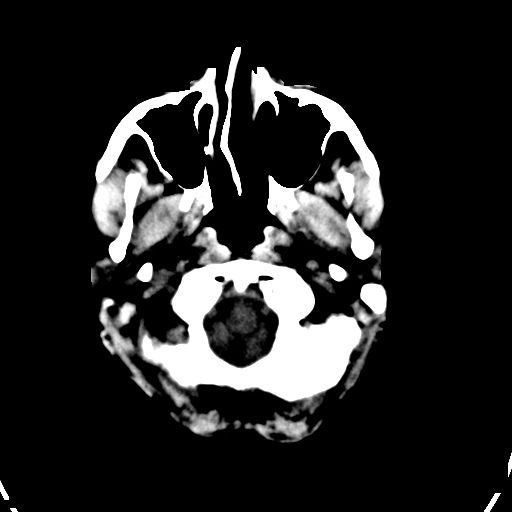
[im 2/29  bone]
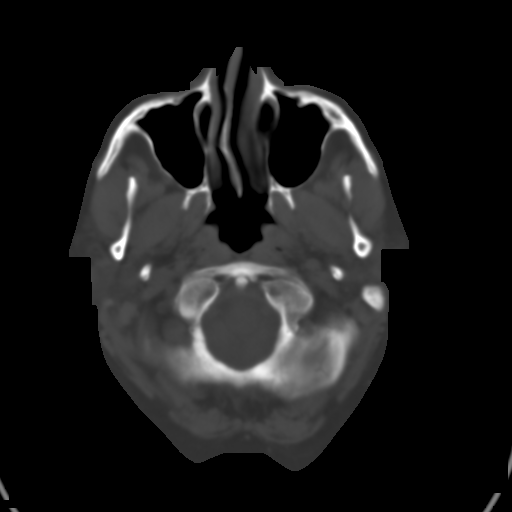
[im 4/29  brain]
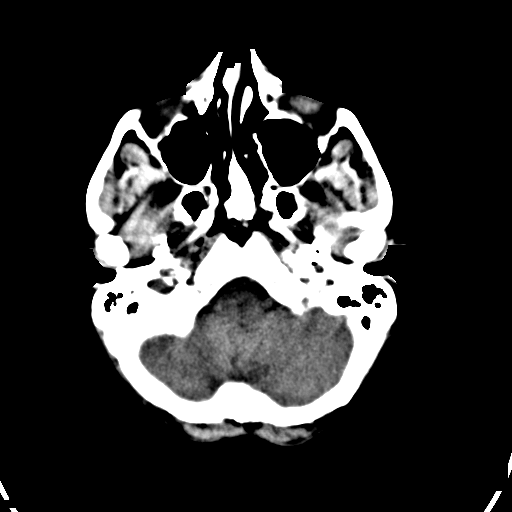
[im 6/29  brain]
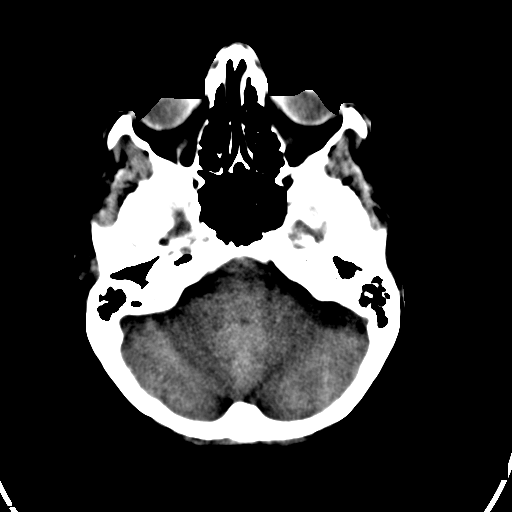
[im 7/29  brain]
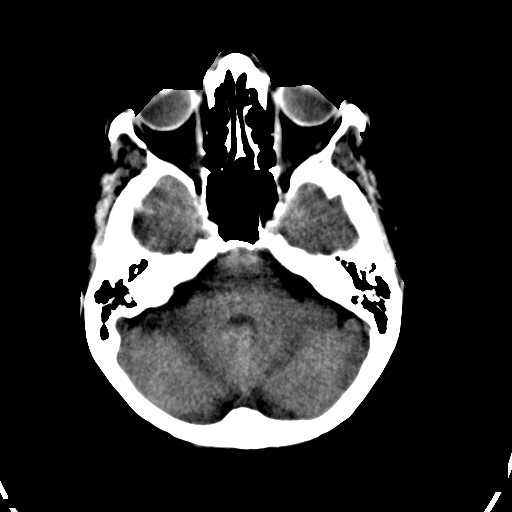
[im 9/29  brain]
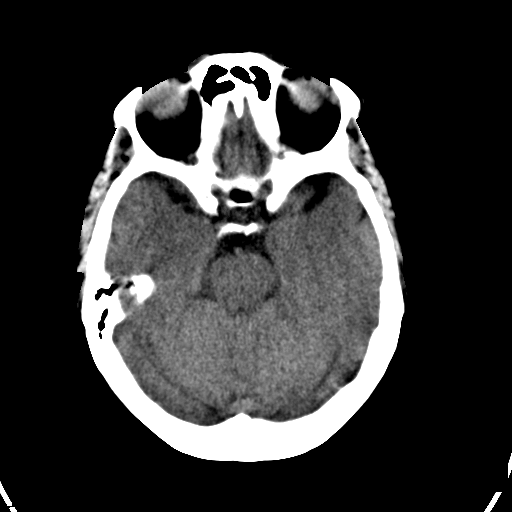
[im 9/29  bone]
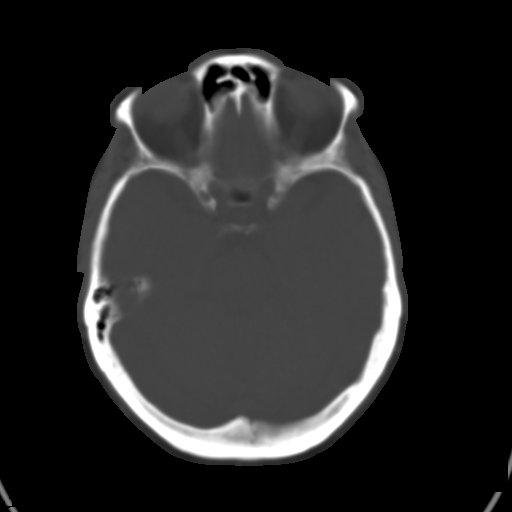
[im 11/29  brain]
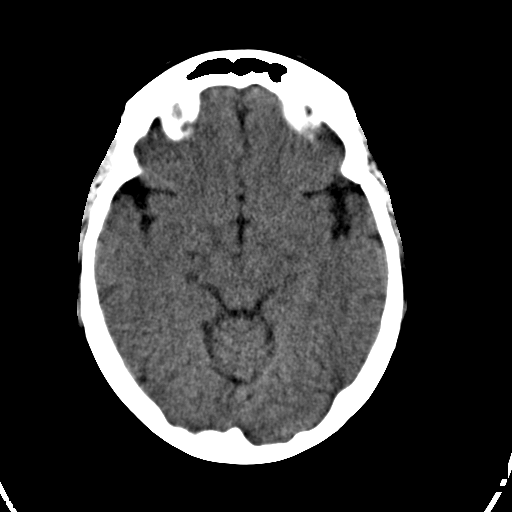
[im 12/29  brain]
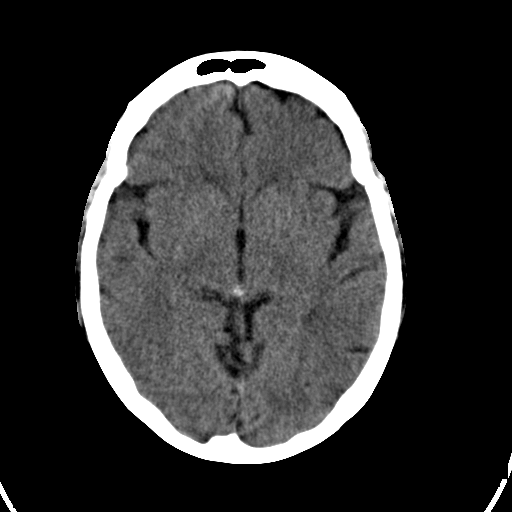
[im 14/29  brain]
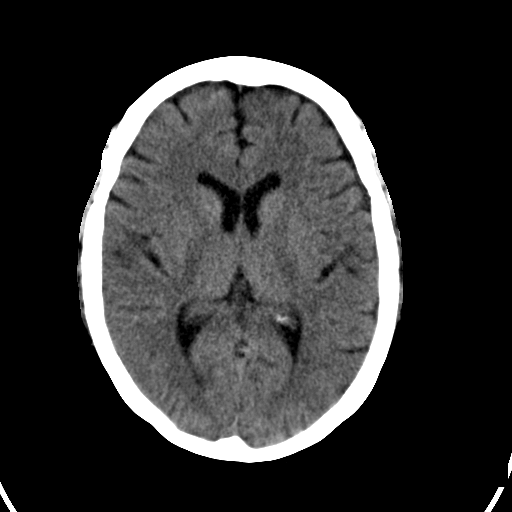
[im 16/29  brain]
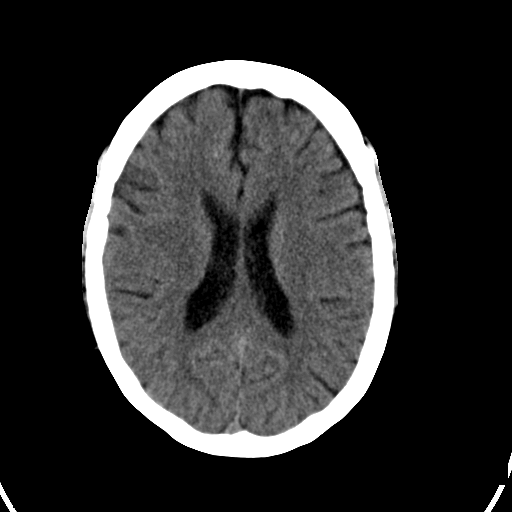
[im 16/29  bone]
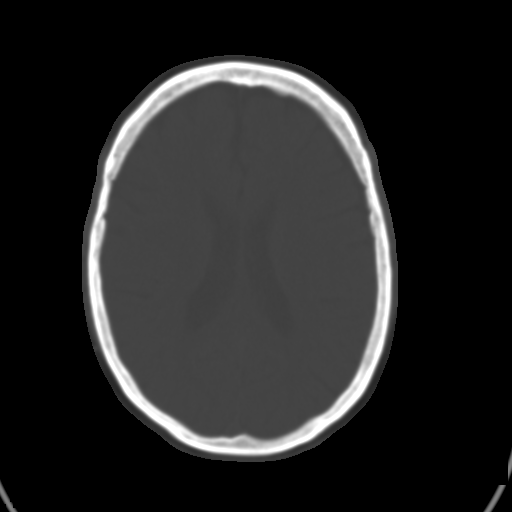
[im 18/29  brain]
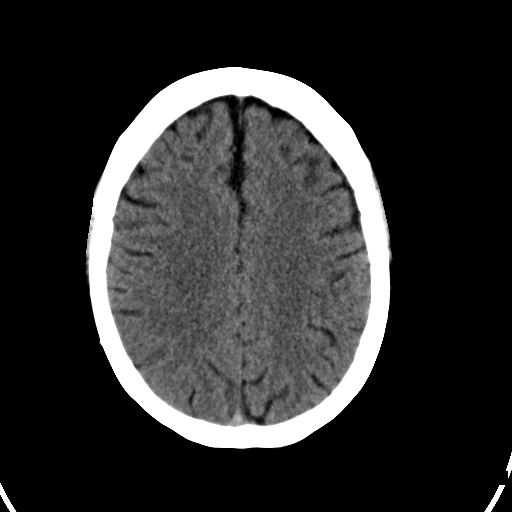
[im 19/29  brain]
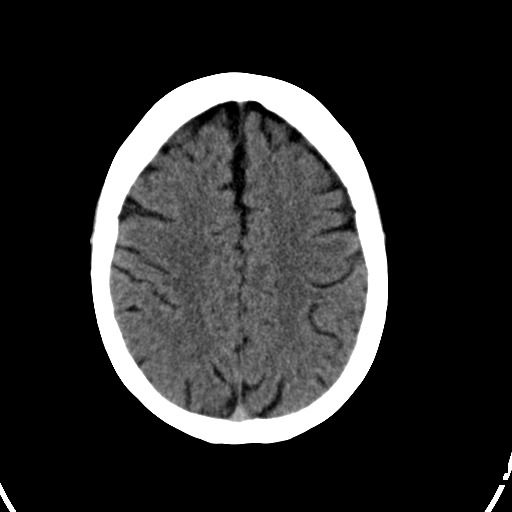
[im 21/29  brain]
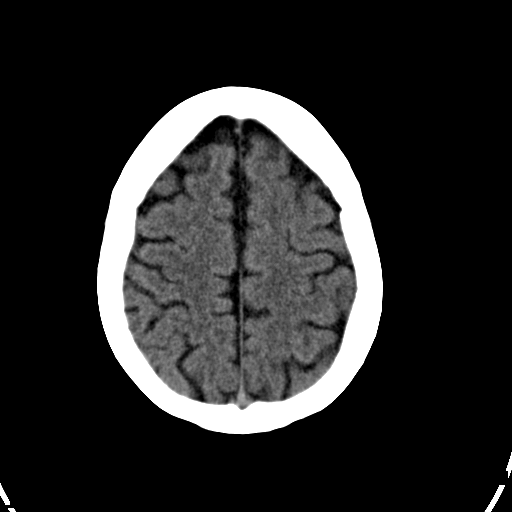
[im 23/29  brain]
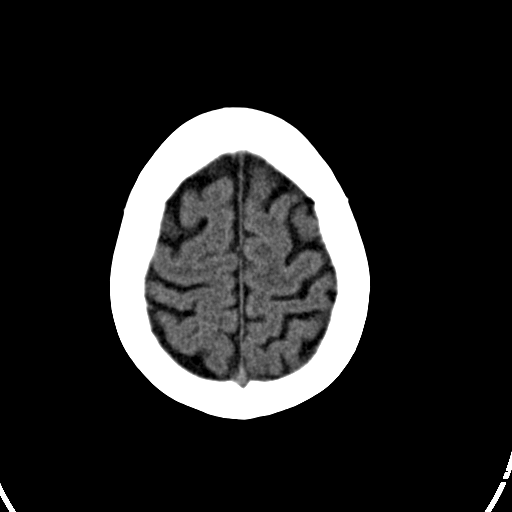
[im 23/29  bone]
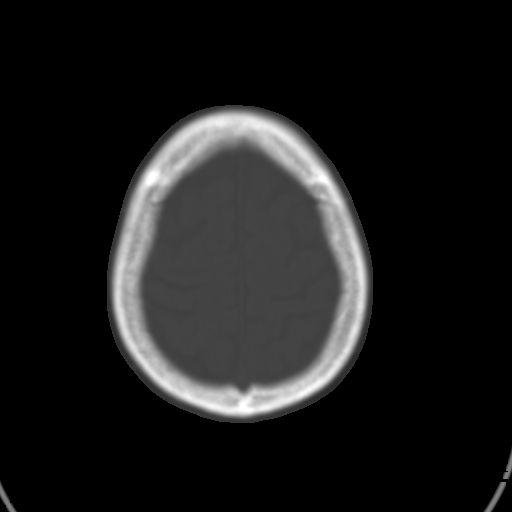
[im 24/29  brain]
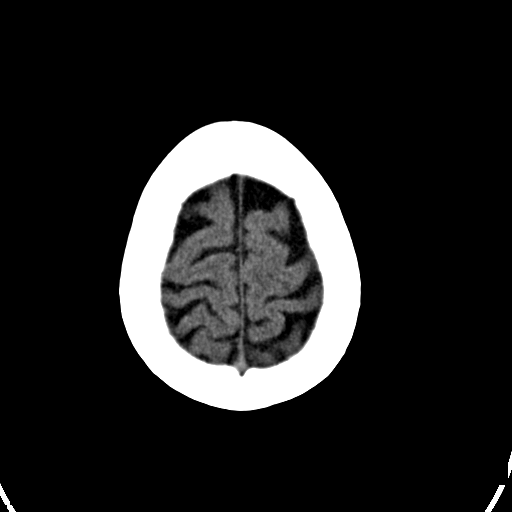
[im 26/29  brain]
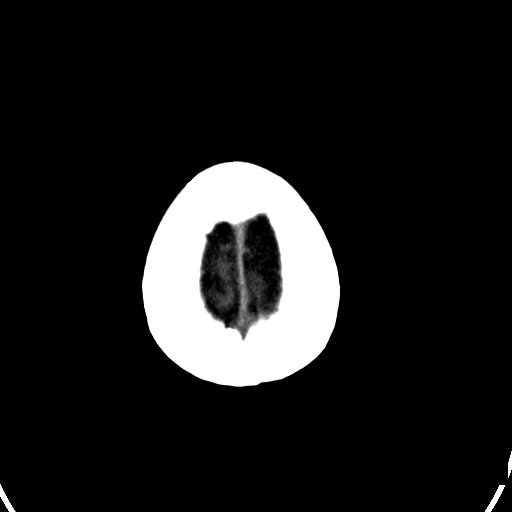
[im 28/29  brain]
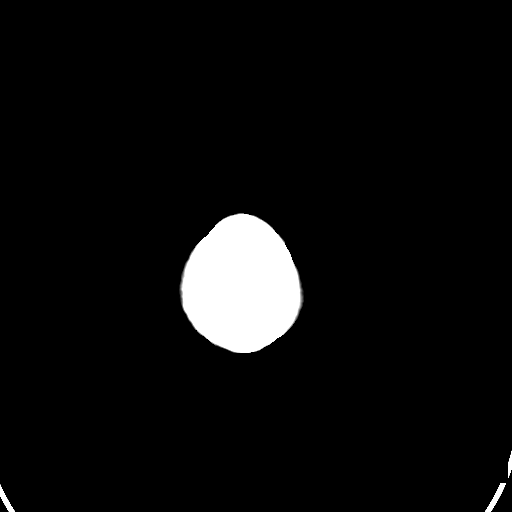

[16 of 29 positions shown; findings below may reference images not displayed]

FINDINGS: Standard nonenhanced CT obtained. No mass. No hydrocephalus. No
hemorrhage. Orbits and globes are normal. Optic nerve appears normal. No
acute bony abnormality. Paranasal sinuses are are clear where visualized.
IMPRESSION: No acute abnormality.

## 2015-10-20 LAB — HM MAMMOGRAPHY: HM Mammogram: NEGATIVE

## 2015-11-04 ENCOUNTER — Encounter: Payer: Self-pay | Admitting: Internal Medicine

## 2015-11-08 ENCOUNTER — Encounter: Payer: Self-pay | Admitting: Internal Medicine

## 2016-05-31 ENCOUNTER — Ambulatory Visit: Payer: Self-pay | Admitting: Family Medicine

## 2016-07-10 ENCOUNTER — Telehealth: Payer: Self-pay | Admitting: Internal Medicine

## 2016-07-10 NOTE — Telephone Encounter (Signed)
Pt called regarding a no show bill that she received. Pt stated that we had the wrong phone number on file and stated that she lost her appointment card and it was not totally her fault that she missed her appointment. Patient stated when she made her appointment she assumed that we had all the correct information because she had come in with her husband. She feels that she is not responsible for the no show bill. Please Advise. Thank you!  Call pt@ [phone]

## 2016-07-16 NOTE — Telephone Encounter (Signed)
Okay to void charge.

## 2016-07-16 NOTE — Telephone Encounter (Signed)
No show fee removal sent to [employer].

## 2017-03-14 ENCOUNTER — Other Ambulatory Visit: Payer: Self-pay | Admitting: Obstetrics and Gynecology

## 2017-03-14 MED ORDER — ESTRADIOL 10 MCG VA TABS: 1.0000 | ORAL_TABLET | VAGINAL | 6 refills | Status: DC

## 2017-05-06 ENCOUNTER — Telehealth: Payer: Self-pay

## 2017-05-06 ENCOUNTER — Other Ambulatory Visit: Payer: Self-pay | Admitting: Obstetrics and Gynecology

## 2017-05-06 MED ORDER — ESTRADIOL 0.025 MG/24HR TD PTTW: 1.0000 | MEDICATED_PATCH | TRANSDERMAL | 1 refills | Status: DC

## 2017-05-06 MED ORDER — PROGESTERONE MICRONIZED 100 MG PO CAPS: 100.0000 mg | ORAL_CAPSULE | Freq: Every day | ORAL | 1 refills | Status: DC

## 2017-05-06 NOTE — Telephone Encounter (Signed)
Pt requesting all maintenance  medications be sent to [facility] due to an insurance change. Pharmacy has been changed in computer. Please rx medications to correct pharmacy.

## 2017-05-06 NOTE — Telephone Encounter (Signed)
Done

## 2017-05-06 NOTE — Progress Notes (Signed)
Rx RF sent to mail order. Annual due 1/19.

## 2017-05-24 ENCOUNTER — Telehealth: Payer: Self-pay

## 2017-05-24 NOTE — Telephone Encounter (Signed)
Pt states her husband changed jobs & she needs a new rx for Estradiol 0.025mg  patch sent to [facility] in order for new insurance to cover. [id number]#[id number]#[id number].

## 2017-05-24 NOTE — Telephone Encounter (Signed)
Spoke w/pt. Notified [facility] out of office til Monday. Pt has enough meds to last a few weeks.

## 2017-05-25 NOTE — Telephone Encounter (Signed)
RN to notify pt all Rxs eRxd to [facility] 05/06/17 per her msg that date. She can check with pharmacy. If they don't have it, we can resend.

## 2017-05-27 NOTE — Telephone Encounter (Signed)
Pt aware.

## 2017-07-06 ENCOUNTER — Other Ambulatory Visit: Payer: Self-pay | Admitting: Obstetrics and Gynecology

## 2017-09-07 ENCOUNTER — Other Ambulatory Visit: Payer: Self-pay | Admitting: Obstetrics and Gynecology

## 2017-11-21 ENCOUNTER — Other Ambulatory Visit: Payer: Self-pay | Admitting: Obstetrics and Gynecology

## 2017-12-13 DIAGNOSIS — Z1211 Encounter for screening for malignant neoplasm of colon: Secondary | ICD-10-CM

## 2017-12-13 HISTORY — DX: Encounter for screening for malignant neoplasm of colon: Z12.11

## 2017-12-23 ENCOUNTER — Ambulatory Visit (INDEPENDENT_AMBULATORY_CARE_PROVIDER_SITE_OTHER): Payer: Medicare Other | Admitting: Obstetrics and Gynecology

## 2017-12-23 ENCOUNTER — Telehealth: Payer: Self-pay | Admitting: Obstetrics and Gynecology

## 2017-12-23 ENCOUNTER — Encounter: Payer: Self-pay | Admitting: Obstetrics and Gynecology

## 2017-12-23 VITALS — BP 110/68 | HR 77 | Ht 62.0 in | Wt 138.0 lb

## 2017-12-23 DIAGNOSIS — Z1231 Encounter for screening mammogram for malignant neoplasm of breast: Secondary | ICD-10-CM

## 2017-12-23 DIAGNOSIS — N951 Menopausal and female climacteric states: Secondary | ICD-10-CM

## 2017-12-23 DIAGNOSIS — Z124 Encounter for screening for malignant neoplasm of cervix: Secondary | ICD-10-CM | POA: Diagnosis not present

## 2017-12-23 DIAGNOSIS — N644 Mastodynia: Secondary | ICD-10-CM | POA: Diagnosis not present

## 2017-12-23 DIAGNOSIS — Z1239 Encounter for other screening for malignant neoplasm of breast: Secondary | ICD-10-CM

## 2017-12-23 DIAGNOSIS — Z1151 Encounter for screening for human papillomavirus (HPV): Secondary | ICD-10-CM

## 2017-12-23 DIAGNOSIS — Z01419 Encounter for gynecological examination (general) (routine) without abnormal findings: Secondary | ICD-10-CM | POA: Diagnosis not present

## 2017-12-23 DIAGNOSIS — Z1211 Encounter for screening for malignant neoplasm of colon: Secondary | ICD-10-CM | POA: Diagnosis not present

## 2017-12-23 DIAGNOSIS — Z79890 Hormone replacement therapy (postmenopausal): Secondary | ICD-10-CM | POA: Diagnosis not present

## 2017-12-23 LAB — HEMOCCULT GUIAC POC 1CARD (OFFICE): Fecal Occult Blood, POC: NEGATIVE

## 2017-12-23 MED ORDER — PROGESTERONE MICRONIZED 100 MG PO CAPS: 100.0000 mg | ORAL_CAPSULE | Freq: Every day | ORAL | 3 refills | Status: DC

## 2017-12-23 MED ORDER — ESTRADIOL 0.5 MG PO TABS: ORAL_TABLET | ORAL | 1 refills | Status: DC

## 2017-12-23 NOTE — Telephone Encounter (Signed)
Called Pt she is not wanting to have a medication sent to Pharmacy, she is not sure insurance will cover it and she still has some she can take.

## 2017-12-23 NOTE — Telephone Encounter (Signed)
Ok. Will hold Rx for now.

## 2017-12-23 NOTE — Patient Instructions (Addendum)
I value your feedback and entrusting us with your care. If you get a [facility] patient survey, I would appreciate you taking the time to let us know about your experience today. Thank you!  [facility]: [phone]  [facility]: [phone]  

## 2017-12-23 NOTE — Telephone Encounter (Signed)
Pls get more info from pt so pharmacy can be added to system. Pls let me know when done so I can eRx her Rx. Thx

## 2017-12-23 NOTE — Progress Notes (Signed)
PCP: Patient, No Pcp Per   Chief Complaint  Patient presents with  . Gynecologic Exam    Throbbing in both breast     HPI:      [name] is a 62 y.o. G2P2002 who LMP was No LMP recorded. Patient is postmenopausal., presents today for her annual examination.  Her menses are absent due to menopause. She does not have intermenstrual bleeding. She does have vasomotor sx, particularly night sweats. She is on estradiol 0.025 mg patch and prometrium 100 mg QHS, 6 nights on, 1 night off. She has decreased prog dose on her own this past yr to 3 times weekly since not covered on insurance.  She has increased vasomotor sx from last yr and throbbing breasts without mass for the past month.   Sex activity: single partner, contraception - post menopausal status. She does have vaginal dryness. Stopped vag ERT due to messiness of crm. Using coconut oil with sx control.   Last Pap: August 26, 2014  Results were: no abnormalities /neg HPV DNA.  Hx of STDs: none  Last mammogram: 2018 at [facility]--don't have copy of results.  Results were: normal--routine follow-up in 12 months There is no FH of breast cancer. There is no FH of ovarian cancer. The patient does not do self-breast exams.  Colonoscopy: never, declined in past.   Tobacco use: The patient denies current or previous tobacco use. Alcohol use: none Exercise: very active  She does get adequate calcium and Vitamin D in her diet.  Labs with PCP.   Past Medical History:  Diagnosis Date  . Chicken pox   . Chronic headaches   . GERD (gastroesophageal reflux disease)   . High risk for fracture due to osteoporosis by DEXA scan    Spine  . Migraine   . MS (multiple sclerosis) (HCC)    Dr. Sherryll BurgerShah  . Osteopenia   . Postmenopausal atrophic vaginitis   . UTI (lower urinary tract infection)     Past Surgical History:  Procedure Laterality Date  . CESAREAN SECTION     x 2    Family History  Problem Relation Age of Onset  . Rheum  arthritis Mother   . Pulmonary fibrosis Mother   . Hypertension Mother   . Heart disease Father   . Other Father   . Arthritis Brother   . Lymphoma Maternal Grandmother   . Myasthenia gravis Maternal Grandfather   . Early death Paternal Grandmother        During childbirth    Social History   Socioeconomic History  . Marital status: Married    Spouse name: Not on file  . Number of children: 2  . Years of education: Not on file  . Highest education level: Not on file  Social Needs  . Financial resource strain: Not on file  . Food insecurity - worry: Not on file  . Food insecurity - inability: Not on file  . Transportation needs - medical: Not on file  . Transportation needs - non-medical: Not on file  Occupational History  . Not on file  Tobacco Use  . Smoking status: Former Smoker    Last attempt to quit: 09/20/2001    Years since quitting: 16.2  . Smokeless tobacco: Never Used  Substance and Sexual Activity  . Alcohol use: Yes    Comment: Occasional - wine or mixed drink  . Drug use: No  . Sexual activity: Yes    Birth control/protection: Post-menopausal  Other Topics  Concern  . Not on file  Social History Narrative   Lives in [place] with husband. 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 Hobbies- beading.      Regular Exercise -  Yes, yoga, stretch & balance, gym - stationary bike, piliates    Daily Caffeine Use:  1 coffee in am      2 children & 3 grandchildren             Current Meds  Medication Sig  . Ascorbic Acid (VITAMIN C) 100 MG tablet Take 100 mg by mouth daily.  Marland Kitchen aspirin EC 81 MG tablet Take by mouth.  Duard Larsen Berry-Chromium [place] 500-0.2 MG TABS Take by mouth.  . Calcium-Vitamin D-Vitamin K (CALCIUM + D + K PO) Take by mouth.  . Cholecalciferol (VITAMIN D-1000 MAX ST) 1000 units tablet Take by mouth.  . Cranberry 400 MG CAPS Take by mouth.  . Dimethyl Fumarate (TECFIDERA) 240 MG CPDR Take by mouth.  . fish oil-omega-3 fatty acids 1000 MG  capsule Take 2 g by mouth daily.  . magnesium oxide (MAG-OX) 400 MG tablet Take by mouth.  . metaxalone (SKELAXIN) 800 MG tablet Take 800 mg by mouth 3 (three) times daily as needed.    . Multiple Vitamins-Minerals (MULTIVITAMIN WITH MINERALS) tablet Take 1 tablet by mouth daily.  . nitrofurantoin, macrocrystal-monohydrate, (MACROBID) 100 MG capsule Take 100 mg by mouth once. After intercourse   . progesterone (PROMETRIUM) 100 MG capsule Take 1 capsule (100 mg total) by mouth daily. 6 days on, 1 day off  . thiamine 100 MG tablet Take by mouth.  . vitamin B-12 (CYANOCOBALAMIN) 1000 MCG tablet Take by mouth.  . [DISCONTINUED] estradiol (VIVELLE-DOT) 0.025 MG/24HR PLACE 1 PATCH ONTO THE SKIN EVERY 3 DAYS  . [DISCONTINUED] progesterone (PROMETRIUM) 100 MG capsule Take 1 capsule (100 mg total) by mouth daily. 6 days on, 1 day off      ROS:  Review of Systems  Constitutional: Negative for fatigue, fever and unexpected weight change.  Respiratory: Negative for cough, shortness of breath and wheezing.   Cardiovascular: Negative for chest pain, palpitations and leg swelling.  Gastrointestinal: Negative for blood in stool, constipation, diarrhea, nausea and vomiting.  Endocrine: Negative for cold intolerance, heat intolerance and polyuria.  Genitourinary: Negative for dyspareunia, dysuria, flank pain, frequency, genital sores, hematuria, menstrual problem, pelvic pain, urgency, vaginal bleeding, vaginal discharge and vaginal pain.  Musculoskeletal: Negative for back pain, joint swelling and myalgias.  Skin: Negative for rash.  Neurological: Negative for dizziness, syncope, light-headedness, numbness and headaches.  Hematological: Negative for adenopathy.  Psychiatric/Behavioral: Negative for agitation, confusion, sleep disturbance and suicidal ideas. The patient is not nervous/anxious.      Objective: BP 110/68   Pulse 77   Ht 5\' 2"  (1.575 m)   Wt 138 lb (62.6 kg)   BMI 25.24 kg/m     Physical Exam  Constitutional: She is oriented to person, place, and time. She appears well-developed and well-nourished.  Genitourinary: Rectum normal, vagina normal and uterus normal. There is no rash or tenderness on the right labia. There is no rash or tenderness on the left labia. No erythema or tenderness in the vagina. No vaginal discharge found. Right adnexum does not display mass and does not display tenderness. Left adnexum does not display mass and does not display tenderness. Cervix does not exhibit motion tenderness or polyp. Uterus is not enlarged or tender. Rectal exam shows no fissure, no tenderness and guaiac negative stool.  Neck: Normal range of  motion. No thyromegaly present.  Cardiovascular: Normal rate, regular rhythm and normal heart sounds.  No murmur heard. Pulmonary/Chest: Effort normal and breath sounds normal. Right breast exhibits no mass, no nipple discharge, no skin change and no tenderness. Left breast exhibits no mass, no nipple discharge, no skin change and no tenderness.  Abdominal: Soft. There is no tenderness. There is no guarding.  Musculoskeletal: Normal range of motion.  Neurological: She is alert and oriented to person, place, and time. No cranial nerve deficit.  Psychiatric: She has a normal mood and affect. Her behavior is normal.  Vitals reviewed.   Results: Results for orders placed or performed in visit on 12/23/17 (from the past 24 hour(s))  POCT Occult Blood Stool     Status: Normal   Collection Time: 12/23/17  2:18 PM  Result Value Ref Range   Fecal Occult Blood, POC Negative Negative   Card #1 Date     Card #2 Fecal Occult Blod, POC     Card #2 Date     Card #3 Fecal Occult Blood, POC     Card #3 Date      Assessment/Plan:  Encounter for annual routine gynecological examination  Cervical cancer screening - Plan: IGP, Aptima HPV  Screening for HPV (human papillomavirus) - Plan: IGP, Aptima HPV  Screening for breast cancer - Pt  to sched mammo at [facility].  - Plan: MM DIGITAL SCREENING BILATERAL  Screening for colon cancer - Neg FOBT. Pt declines colonoscopy. Will do cologard ref and f/u with results. - Plan: POCT Occult Blood Stool  Vasomotor symptoms due to menopause - Pt with sx but not using meds correctly. Resume prog as directed, change to estradiol 0.25 mg (1/2 tab 0.5mg ) due to ins. Pt to call with pharm. F/u prn sx.  - Plan: progesterone (PROMETRIUM) 100 MG capsule, estradiol (ESTRACE) 0.5 MG tablet  Hormone replacement therapy (HRT) - Plan: progesterone (PROMETRIUM) 100 MG capsule, estradiol (ESTRACE) 0.5 MG tablet  Breast tenderness - Neg exam. See if sx improve with resumption of prog dose. Getting mammo. F/u prn.    Meds ordered this encounter  Medications  . progesterone (PROMETRIUM) 100 MG capsule    Sig: Take 1 capsule (100 mg total) by mouth daily. 6 days on, 1 day off    Dispense:  90 capsule    Refill:  3    Order Specific Question:   Supervising Provider    Answer:   Nadara Mustard [id number]  . estradiol (ESTRACE) 0.5 MG tablet    Sig: Take 1/2 tab daily    Dispense:  90 tablet    Refill:  1    Order Specific Question:   Supervising Provider    Answer:   Nadara Mustard [[id number]]   Rx not sent since pt doesn't have new pharmacy info. Will call with mail order info for Rx to be sent.          GYN counsel breast self exam, mammography screening, use and side effects of HRT, menopause, adequate intake of calcium and vitamin D, diet and exercise    F/U  Return in about 1 year (around 12/24/2018). Pt moving to [place] in about 6 months.   Coriana Angello B. Murrell Elizondo, PA-C 12/23/2017 2:21 PM

## 2017-12-23 NOTE — Telephone Encounter (Signed)
[facility] Faxed # [phone]. Update Pharmacy

## 2017-12-25 ENCOUNTER — Encounter: Payer: Self-pay | Admitting: Obstetrics and Gynecology

## 2017-12-25 LAB — IGP, APTIMA HPV
HPV Aptima: NEGATIVE
PAP Smear Comment: 0

## 2017-12-31 ENCOUNTER — Encounter: Payer: Self-pay | Admitting: Obstetrics and Gynecology

## 2018-01-07 LAB — COLOGUARD

## 2018-01-13 ENCOUNTER — Encounter: Payer: Self-pay | Admitting: Obstetrics and Gynecology

## 2018-01-14 ENCOUNTER — Encounter: Payer: Self-pay | Admitting: Obstetrics and Gynecology

## 2018-01-21 ENCOUNTER — Telehealth: Payer: Self-pay

## 2018-01-21 NOTE — Telephone Encounter (Signed)
Spoke with pt. She is doing 1/2 estradiol 0.025 mg patch twice wkly. Do for a couple wks, then d/c. After 2-3 wks, then decrease prometrium to QOHS for 2-3 wks, then stop. F/u prn.

## 2018-01-21 NOTE — Telephone Encounter (Signed)
Pt states she saw Helmut Musterlicia about a week ago and they discussed taking her off hormone supplements she is aware of cutting the patch in half but is unsure of how she would need to get off of the prometrium.

## 2018-01-22 ENCOUNTER — Encounter: Payer: Self-pay | Admitting: Obstetrics and Gynecology

## 2018-01-23 ENCOUNTER — Telehealth: Payer: Self-pay | Admitting: Obstetrics and Gynecology

## 2018-01-23 NOTE — Telephone Encounter (Signed)
Pt aware of Cat [facility]B [facility]. Has bx sched for 02/14/18. Questions answered. Will f/u with bx results.

## 2018-02-04 ENCOUNTER — Telehealth: Payer: Self-pay

## 2018-02-04 NOTE — Telephone Encounter (Signed)
Pt calling today stating that she was seen in [place] for mammo and additional imaging. They state that she needs bx of breast and [facility]. But [facility] is [facility]. [facility] and they are requesting records from [place] and they will get the appt for bx scheduled.

## 2018-02-05 ENCOUNTER — Telehealth: Payer: Self-pay

## 2018-02-05 NOTE — Telephone Encounter (Signed)
Pt is down to 1/2 patch twice a week and has increased prometrium to 6 days a week.  Now her breasts are so sore she can hardly move.  She says they are the sorest they've ever been in her whole life.  Should she cut down on the prometrium?  Please adv.  [phone]

## 2018-02-05 NOTE — Telephone Encounter (Signed)
Go ahead and stop patch, then start prometrium QOHS to see if sx improve. F/u prn.

## 2018-02-05 NOTE — Telephone Encounter (Signed)
Pt aware.

## 2018-02-06 ENCOUNTER — Inpatient Hospital Stay
Admission: RE | Admit: 2018-02-06 | Discharge: 2018-02-06 | Disposition: A | Discharge status: Home / Self Care | Payer: Self-pay | Source: Ambulatory Visit | Attending: *Deleted | Admitting: *Deleted

## 2018-02-06 ENCOUNTER — Other Ambulatory Visit: Payer: Self-pay | Admitting: *Deleted

## 2018-02-06 ENCOUNTER — Other Ambulatory Visit: Payer: Self-pay | Admitting: Obstetrics and Gynecology

## 2018-02-06 DIAGNOSIS — R928 Other abnormal and inconclusive findings on diagnostic imaging of breast: Secondary | ICD-10-CM

## 2018-02-06 DIAGNOSIS — Z9289 Personal history of other medical treatment: Secondary | ICD-10-CM

## 2018-02-06 DIAGNOSIS — R921 Mammographic calcification found on diagnostic imaging of breast: Secondary | ICD-10-CM

## 2018-02-18 NOTE — Telephone Encounter (Signed)
Pt states she was told to call when breasts stopped hurting.  She has been taking prometrium qod.  Does she need to start weaning off prometrium now?  [phone]

## 2018-02-19 NOTE — Telephone Encounter (Signed)
Yes, can do it Q3 days for about a wk and then stop.

## 2018-02-19 NOTE — Telephone Encounter (Signed)
Called pt and left VM to call back to discuss how to take medication.

## 2018-02-20 NOTE — Telephone Encounter (Signed)
Pt returning call. [phone]

## 2018-02-24 NOTE — Telephone Encounter (Signed)
Patient is calling back due to missed call please call patient's home number

## 2018-02-24 NOTE — Telephone Encounter (Signed)
Called pt.

## 2018-03-04 ENCOUNTER — Ambulatory Visit
Admission: RE | Admit: 2018-03-04 | Discharge: 2018-03-04 | Disposition: A | Discharge status: Home / Self Care | Payer: Medicare Other | Source: Ambulatory Visit | Attending: Obstetrics and Gynecology | Admitting: Obstetrics and Gynecology

## 2018-03-04 ENCOUNTER — Ambulatory Visit: Payer: Self-pay | Admitting: Internal Medicine

## 2018-03-04 DIAGNOSIS — R928 Other abnormal and inconclusive findings on diagnostic imaging of breast: Secondary | ICD-10-CM

## 2018-03-04 DIAGNOSIS — R921 Mammographic calcification found on diagnostic imaging of breast: Secondary | ICD-10-CM | POA: Diagnosis present

## 2018-03-04 HISTORY — PX: BREAST BIOPSY: SHX20

## 2018-03-05 LAB — SURGICAL PATHOLOGY

## 2018-04-21 ENCOUNTER — Telehealth: Payer: Self-pay

## 2018-04-21 DIAGNOSIS — N951 Menopausal and female climacteric states: Secondary | ICD-10-CM

## 2018-04-21 DIAGNOSIS — Z79890 Hormone replacement therapy (postmenopausal): Secondary | ICD-10-CM

## 2018-04-21 MED ORDER — PROGESTERONE MICRONIZED 100 MG PO CAPS: 100.0000 mg | ORAL_CAPSULE | Freq: Every day | ORAL | 3 refills | Status: DC

## 2018-04-21 MED ORDER — ESTRADIOL 0.5 MG PO TABS: ORAL_TABLET | ORAL | 3 refills | Status: DC

## 2018-04-21 NOTE — Telephone Encounter (Signed)
Pt reporting to [facility] that the coupon off of [facility] is for the patches. Pt believes [facility] was sending in rx for pills. Pt will wait to hear back from [facility]. ZO#[phone]#[phone]

## 2018-04-21 NOTE — Telephone Encounter (Signed)
Pt called triage line needing her hormones back, she's has tried everything otc , but nothing helps. She was told that her insurance will cover them if she had a letter sent to them stating that its medically  necessary.

## 2018-04-21 NOTE — Telephone Encounter (Signed)
Pt wants to restart HRT. Resume prog 100 mg QHS 6 nights on, 1 night off and estradiol 0.25 mg (1/2 tab 0.5 mg). Changed to oral estradiol from patch due to cost. Rx eRxd. F/u prn.  Pt thought insurance wasn't going to cover them, but pt called back and states Rx are $15 each.

## 2018-04-21 NOTE — Telephone Encounter (Signed)
Per [facility] pt advised to enter Estrace 0.5mg  & coupon for pills should come up. Pt states her insurance is covering both rx's for $15. There was a misunderstanding at the pharamcy. She appreciates all our help.

## 2018-04-25 ENCOUNTER — Telehealth: Payer: Self-pay

## 2018-04-25 NOTE — Telephone Encounter (Signed)
Even though she has been on HRT before, it's going to take several wks for it all to get back into her system again to work. She needs to give it more time.

## 2018-04-25 NOTE — Telephone Encounter (Signed)
Please advise. Thank you

## 2018-04-25 NOTE — Telephone Encounter (Signed)
Pt reporting to [facility] how things are going w/her hormone therapy. It's been a week today & she is still having major hot flashes. Pt inquiring if she should increase to whole pill instead of half pill of Estradiol. ZO#[phone]#[phone]

## 2018-04-28 ENCOUNTER — Telehealth: Payer: Self-pay

## 2018-04-28 NOTE — Telephone Encounter (Signed)
I have called pt she stated she would like to take a whole pill and she has been adding another half and she feels better. I advised pt that she should give it time to work, However pt prefers for [place] to give her a call to speak directly

## 2018-04-28 NOTE — Telephone Encounter (Signed)
Pt not seeing much difference in hot flashes with taking 1/2 pill.  She increased it herself to whole pill and is feeling better.  Please call to discuss.  [phone]

## 2018-04-30 NOTE — Telephone Encounter (Signed)
Spoke with pt. Having severe vasomotor sx. Restarted HRT but not enough time yet for sx resolution. Pt wants to take whole pill of estradiol 0.5 mg vs 1/2 pill. That is fine if she is having bad sx. F/u prn Rx RF. Pt aware we prefer the least amt of hormone dose possible.

## 2018-04-30 NOTE — Telephone Encounter (Signed)
LMTRC

## 2018-04-30 NOTE — Telephone Encounter (Signed)
Pt returning [facility] Call

## 2018-11-24 ENCOUNTER — Other Ambulatory Visit: Payer: Self-pay | Admitting: Obstetrics and Gynecology

## 2018-11-24 DIAGNOSIS — Z79890 Hormone replacement therapy (postmenopausal): Secondary | ICD-10-CM

## 2018-11-24 DIAGNOSIS — N951 Menopausal and female climacteric states: Secondary | ICD-10-CM

## 2018-11-24 MED ORDER — ESTRADIOL 0.5 MG PO TABS: ORAL_TABLET | ORAL | 0 refills | Status: AC

## 2018-11-24 NOTE — Progress Notes (Signed)
Rx RF estradiol till 3/20 annual

## 2019-02-27 ENCOUNTER — Other Ambulatory Visit: Payer: Self-pay | Admitting: Obstetrics and Gynecology

## 2019-02-27 ENCOUNTER — Telehealth: Payer: Self-pay

## 2019-02-27 DIAGNOSIS — N951 Menopausal and female climacteric states: Secondary | ICD-10-CM

## 2019-02-27 DIAGNOSIS — Z79890 Hormone replacement therapy (postmenopausal): Secondary | ICD-10-CM

## 2019-02-27 MED ORDER — PROGESTERONE MICRONIZED 100 MG PO CAPS: 100.0000 mg | ORAL_CAPSULE | Freq: Every day | ORAL | 1 refills | Status: AC

## 2019-02-27 NOTE — Telephone Encounter (Signed)
Rx eRxd to [facility] in [facility].

## 2019-02-27 NOTE — Telephone Encounter (Signed)
Pt calling for refill of progesterone as it has no refills on it.  Has moved to [place] and hasn't found a GYN d/t Covid.  Is asking for refill to get her thru to when she does find a GYN there.  [phone]

## 2019-03-02 ENCOUNTER — Other Ambulatory Visit: Payer: Self-pay | Admitting: Obstetrics and Gynecology

## 2019-03-02 DIAGNOSIS — N951 Menopausal and female climacteric states: Secondary | ICD-10-CM

## 2019-03-02 DIAGNOSIS — Z79890 Hormone replacement therapy (postmenopausal): Secondary | ICD-10-CM

## 2019-03-02 NOTE — Telephone Encounter (Signed)
Pt aware.

## 2019-03-02 NOTE — Telephone Encounter (Signed)
Rx eRxd 02/27/19 to pharm in [facility]. Pls have pt check with pharm. Thx
